# Patient Record
Sex: Female | Born: 1970 | Race: Black or African American | Hispanic: No | Marital: Single
Health system: Southern US, Community
[De-identification: ages and names within clinical notes are randomized; demographics above are authoritative.]

---

## 2004-07-01 ENCOUNTER — Emergency Department: Payer: Self-pay | Admitting: Emergency Medicine

## 2004-07-03 ENCOUNTER — Emergency Department: Payer: Self-pay | Admitting: Emergency Medicine

## 2004-07-05 ENCOUNTER — Ambulatory Visit: Payer: Self-pay | Admitting: Emergency Medicine

## 2004-07-09 ENCOUNTER — Ambulatory Visit: Payer: Self-pay | Admitting: Obstetrics & Gynecology

## 2006-02-13 IMAGING — US US PELV - US TRANSVAGINAL
1 series · 17 of 25 positions shown · non-contrast
Comparison: none

REASON FOR EXAM: Pregnant with vaginal bleeding
COMMENTS:

[Series 1: us pelv - us transvaginal · 17 of 41 slices shown]
[im 1/41]
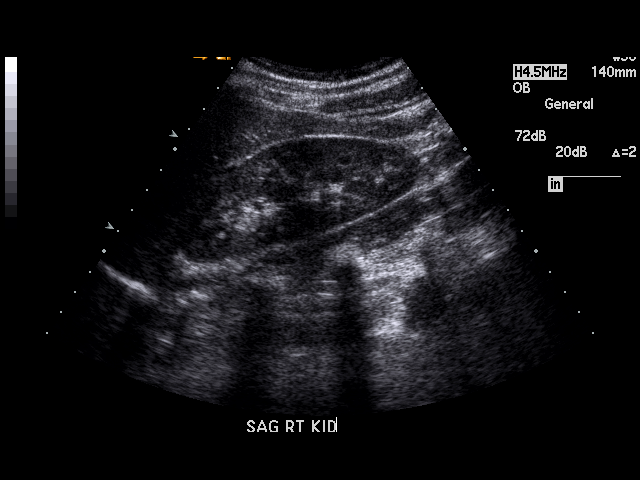
[im 4/41]
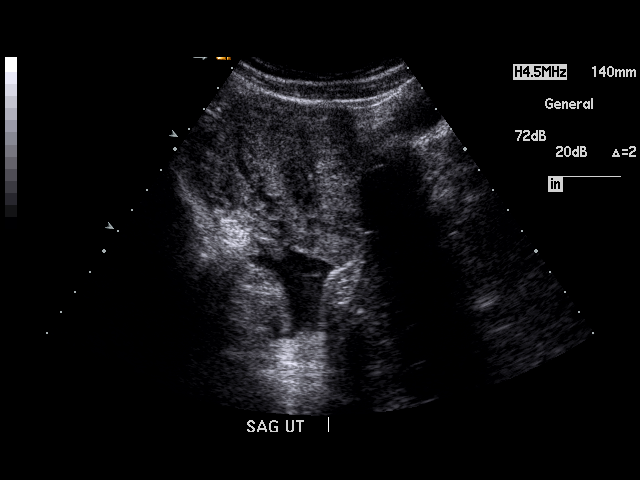
[im 6/41]
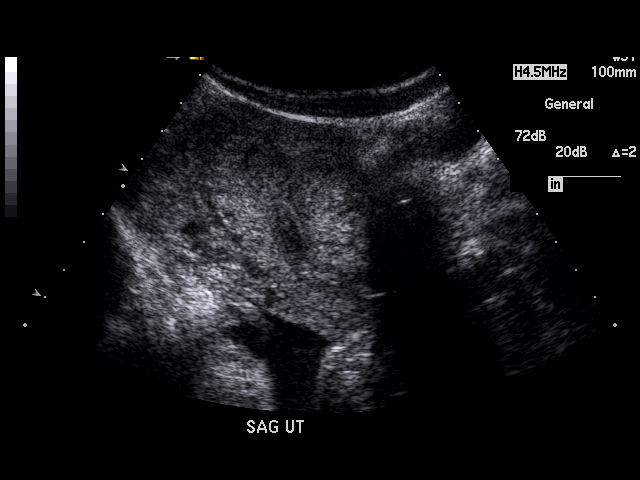
[im 9/41]
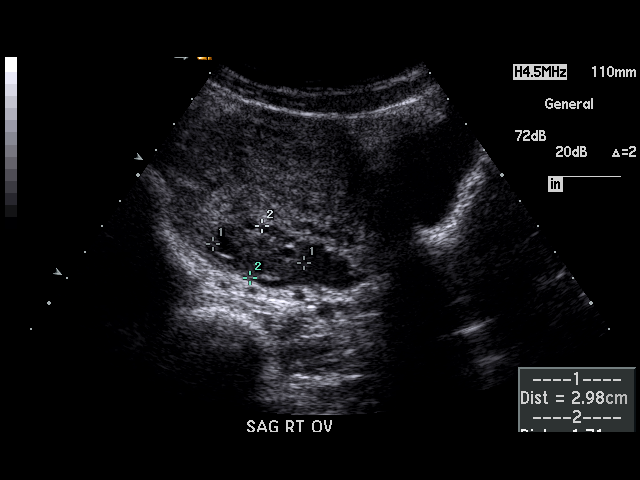
[im 11/41]
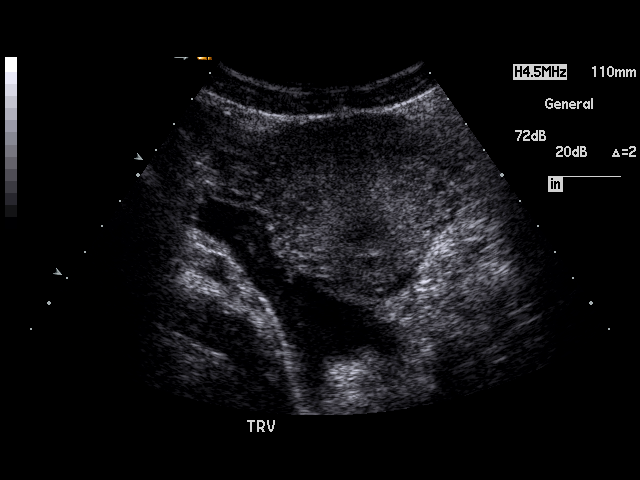
[im 14/41]
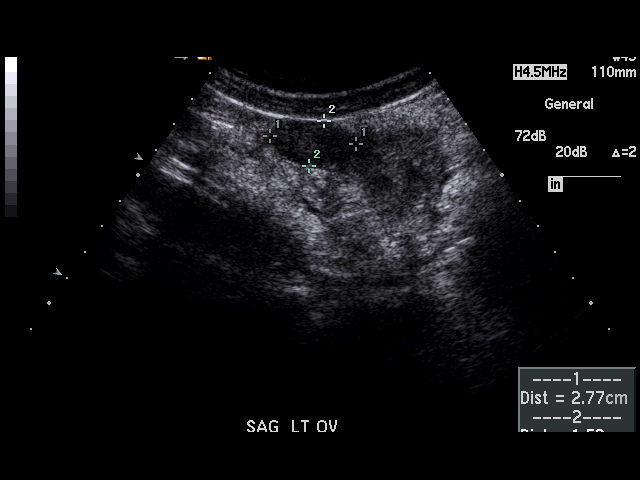
[im 16/41]
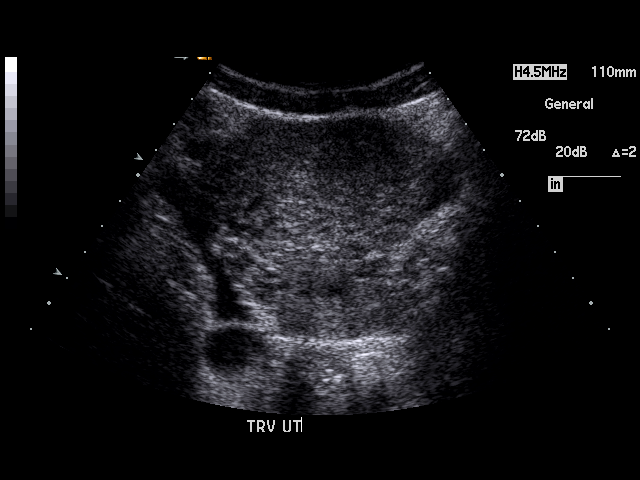
[im 19/41]
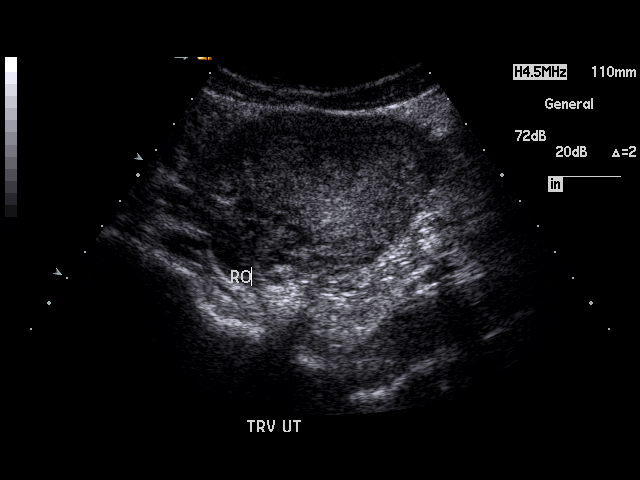
[im 21/41]
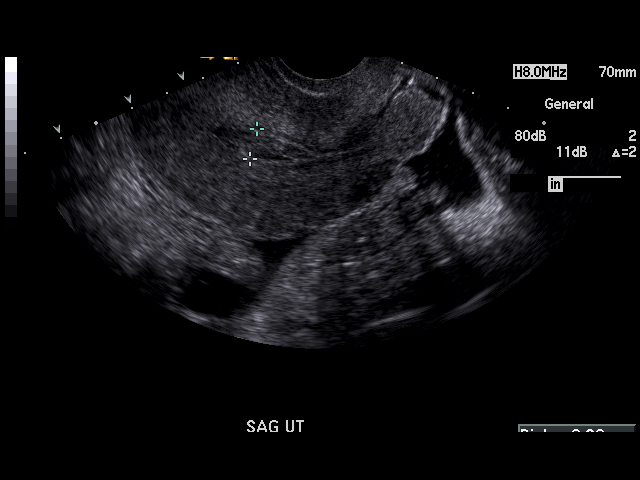
[im 22/41]
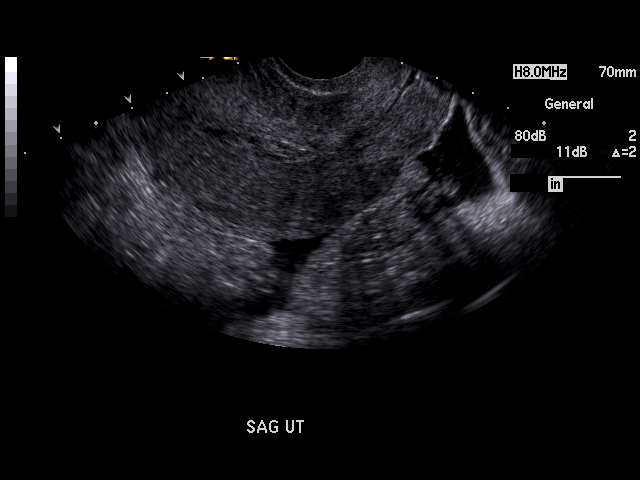
[im 26/41]
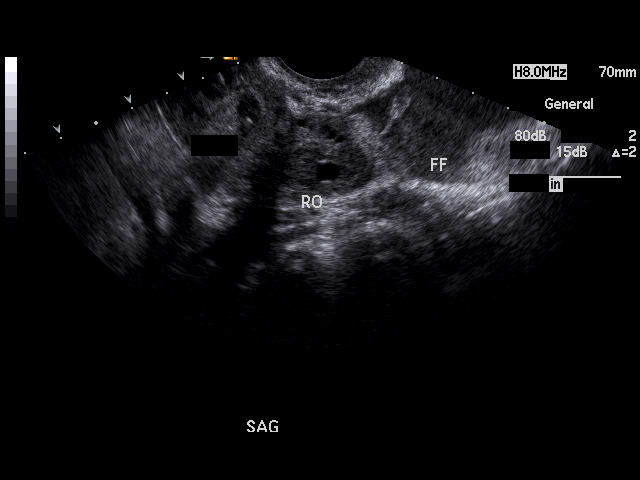
[im 27/41]
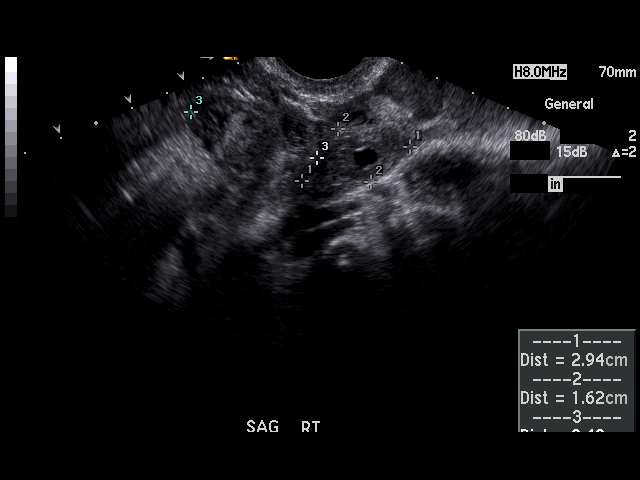
[im 31/41]
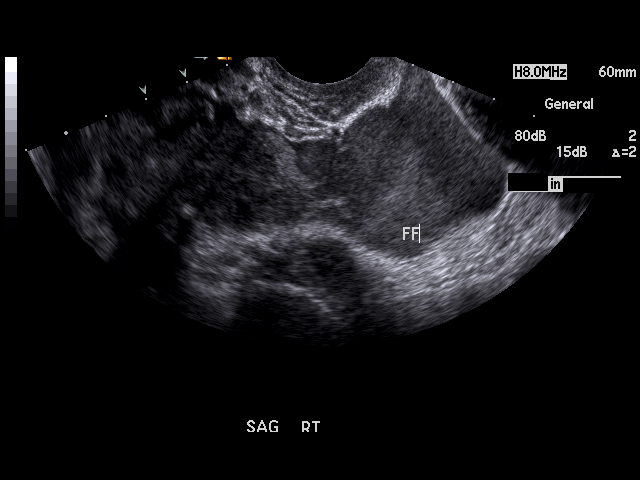
[im 32/41]
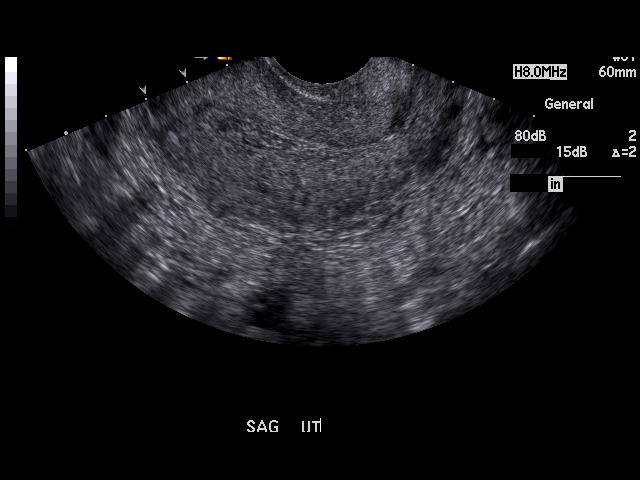
[im 36/41]
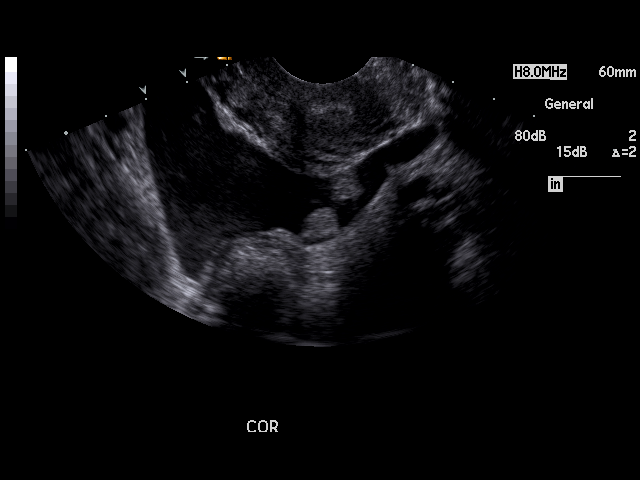
[im 37/41]
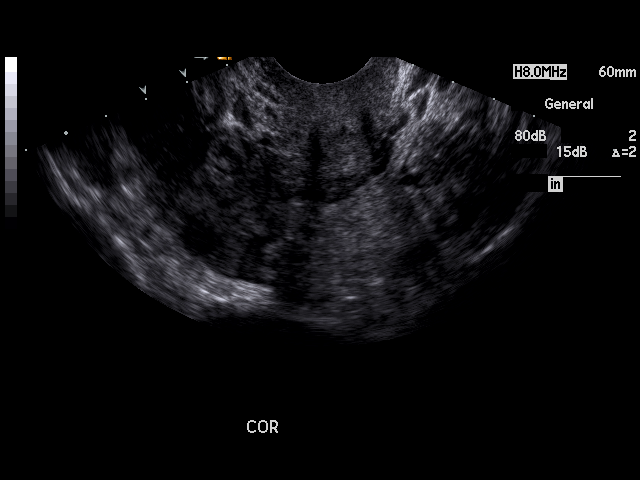
[im 41/41]
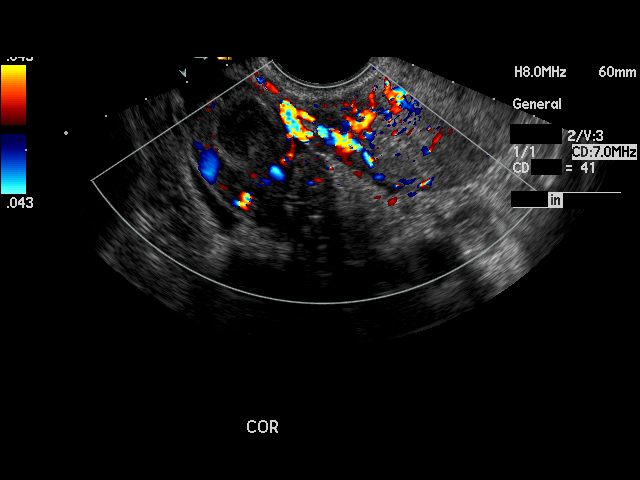

[17 of 25 positions shown; findings below may reference images not displayed]

PROCEDURE:     US  - US PELVIS MASS EXAM  - [DATE] [DATE] [DATE] [DATE]

RESULT:     The current exam is compared to a prior exam of 07/03/2004.

Again, no viable intrauterine gestation is identified. Since the prior study
there has developed a 3.5 cm complex mass in the RIGHT adnexal area adjacent
to the RIGHT ovary. There is a moderate amount of fluid present in the
pelvis which contains debris and could be cellular debris as seen with
hemorrhage. The findings are considered highly suspicious for RIGHT ectopic
pregnancy.
IMPRESSION: There are observed changes as described above consistent
with ectopic pregnancy on the RIGHT.

## 2007-09-23 ENCOUNTER — Inpatient Hospital Stay: Payer: Self-pay

## 2007-10-14 ENCOUNTER — Ambulatory Visit: Payer: Self-pay | Admitting: Obstetrics and Gynecology

## 2009-05-20 IMAGING — US ABDOMEN ULTRASOUND
1 series · 17 of 25 positions shown · non-contrast
Comparison: none

REASON FOR EXAM: severe RUQ abd pain    elevated blood pressure  call
report [id number]
COMMENTS:

[Series 1: abdomen ultrasound · 17 of 71 slices shown]
[im 1/71]
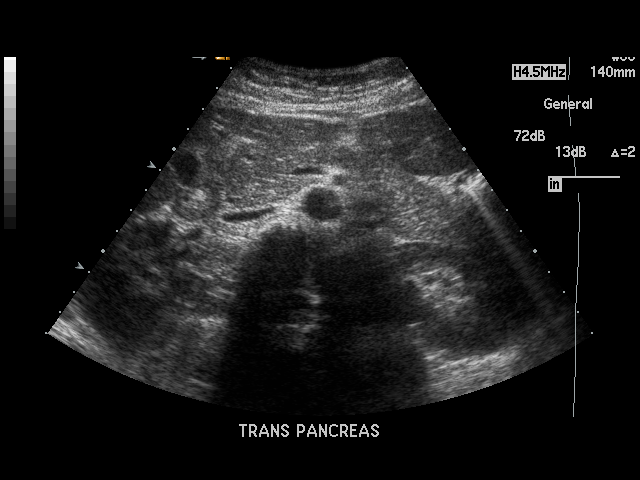
[im 6/71]
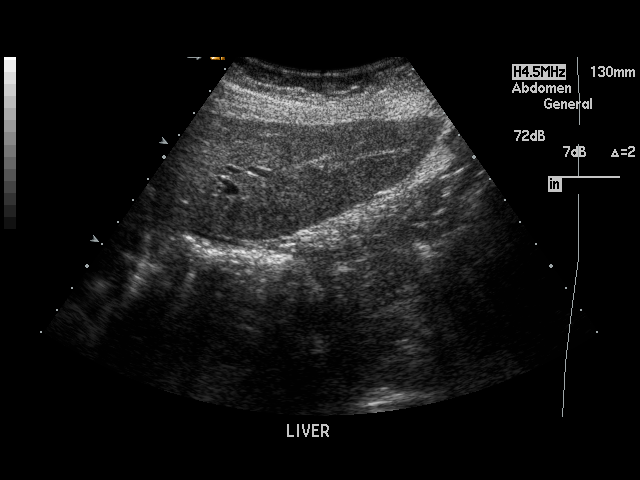
[im 9/71]
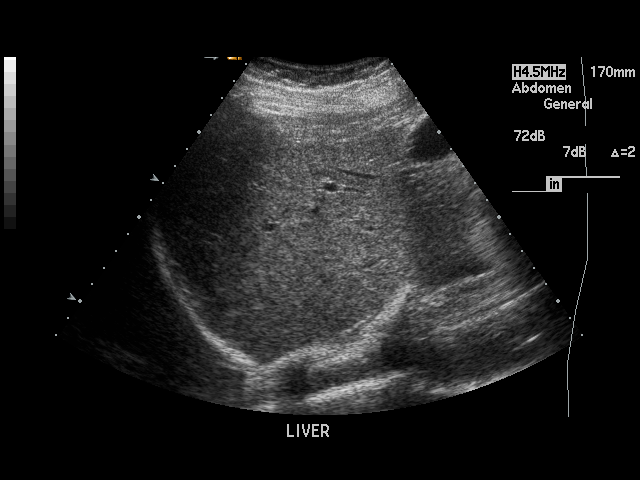
[im 15/71]
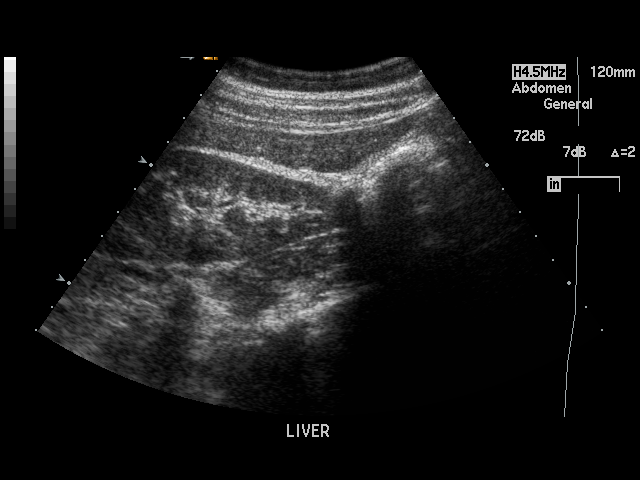
[im 18/71]
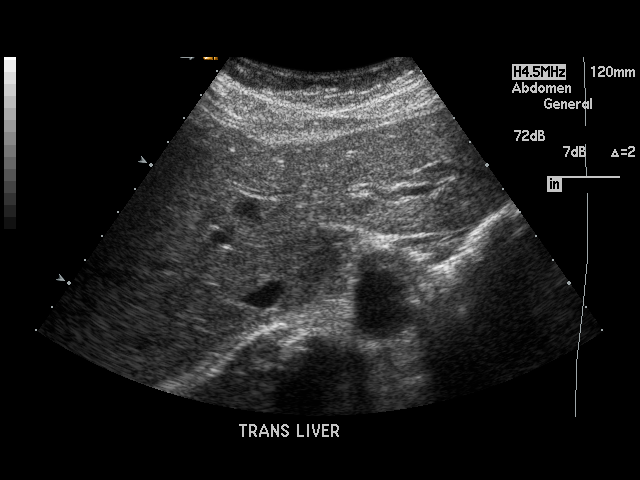
[im 24/71]
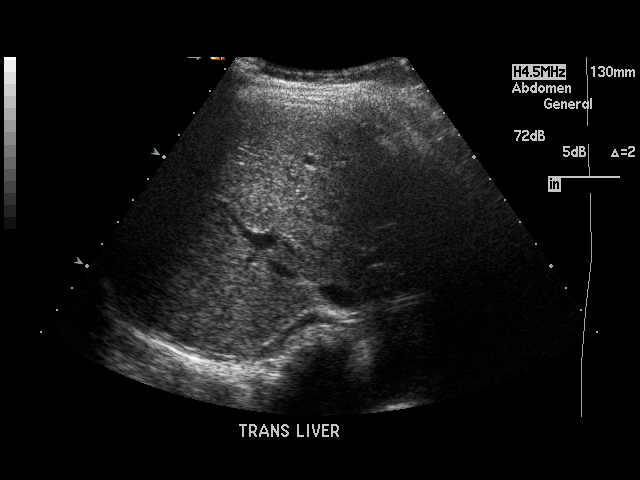
[im 27/71]
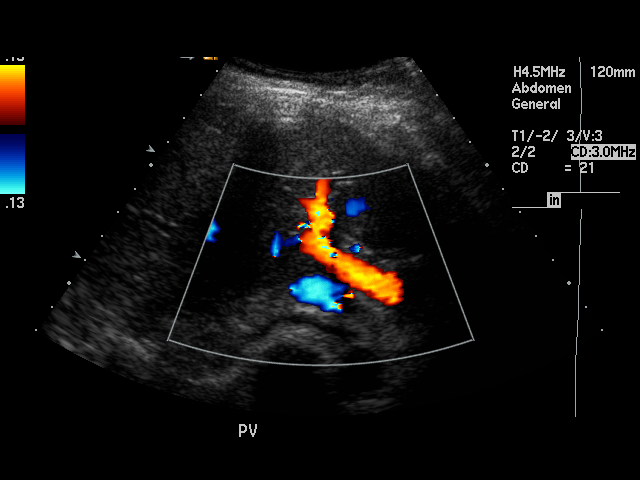
[im 33/71]
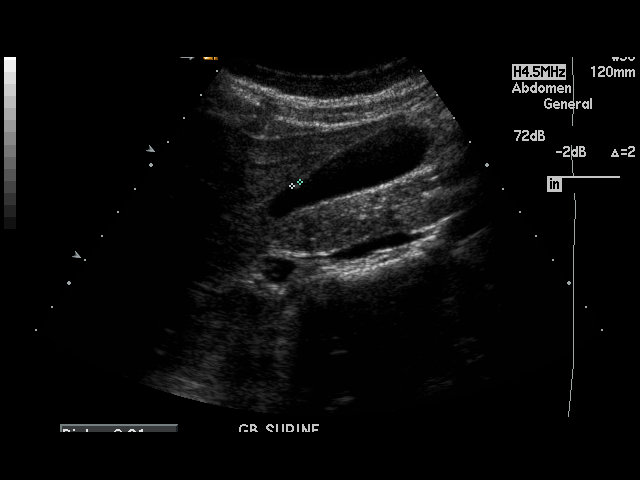
[im 36/71]
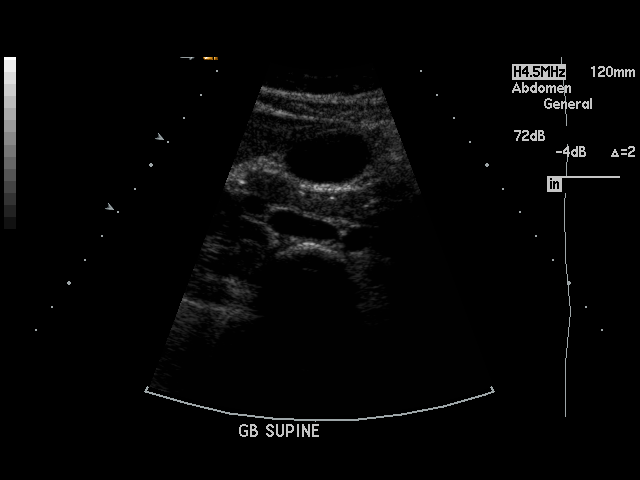
[im 38/71]
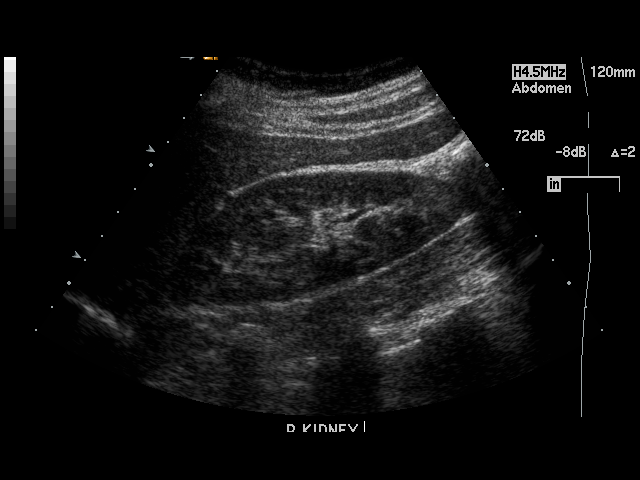
[im 44/71]
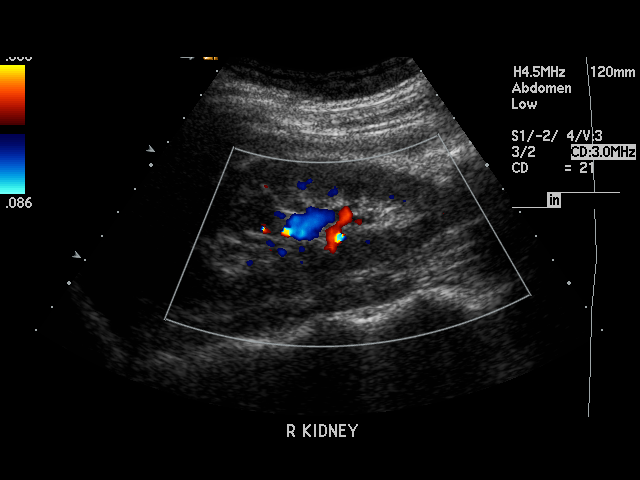
[im 47/71]
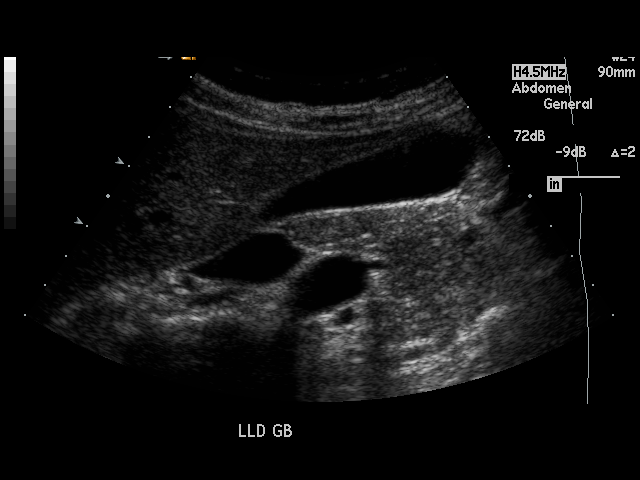
[im 53/71]
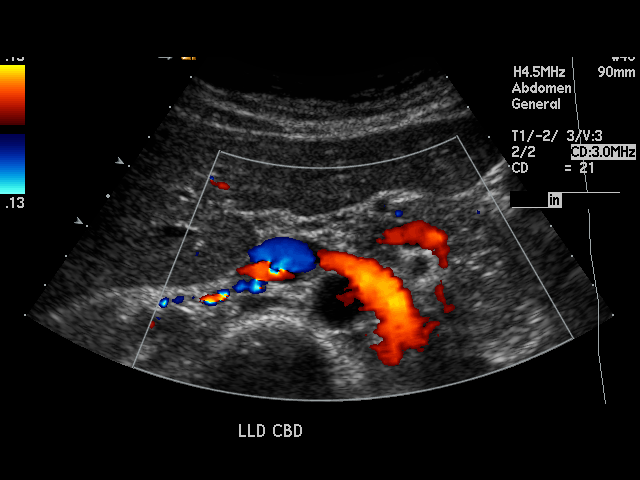
[im 56/71]
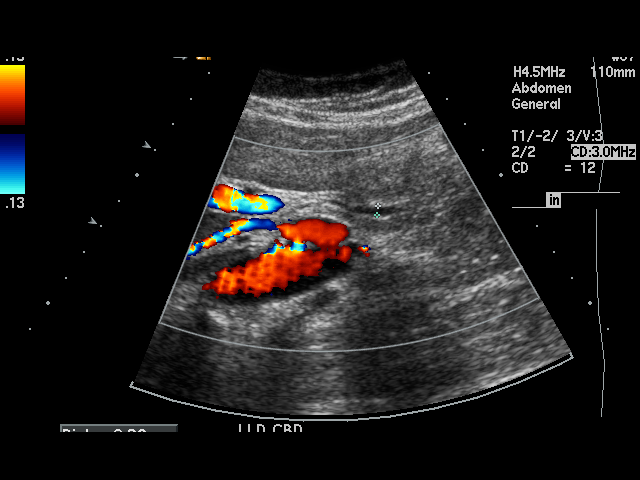
[im 62/71]
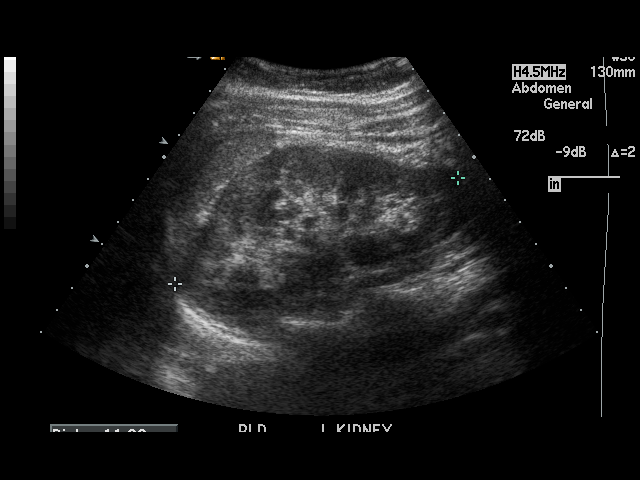
[im 65/71]
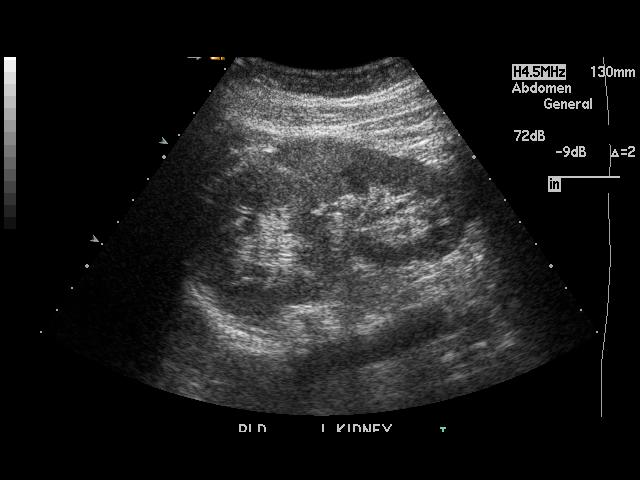
[im 71/71]
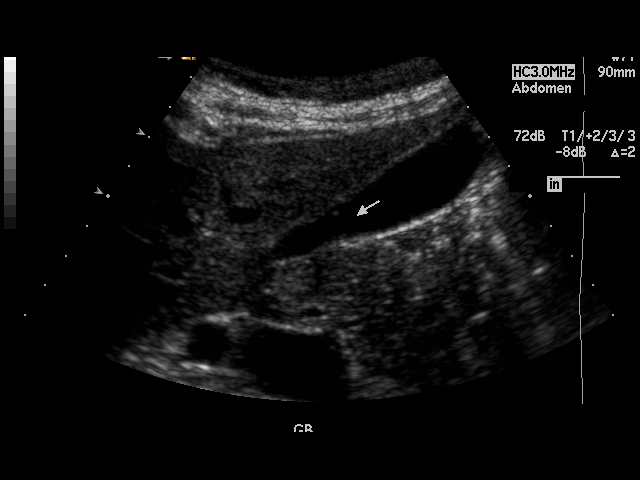

[17 of 25 positions shown; findings below may reference images not displayed]

PROCEDURE:     US  - US ABDOMEN GENERAL SURVEY  - October 14, 2007 [DATE]

RESULT:     The liver, spleen, pancreas, abdominal aorta and inferior vena
cava show no significant abnormalities. No gallstones are seen. There is a
3.1 mm nonmobile, nonshadowing echo density associated with the anterior
aspect of the gallbladder wall. The appearance is consistent with a small
polyp. There is no thickening of the gallbladder wall, which is noted to
measure 1.9 mm in thickness. The common bile duct measures 2.9 mm at maximum
diameter. The kidneys show no hydronephrosis. There is no ascites.
IMPRESSION: 1.     No gallstones are seen.
2.     There is a 3.1 mm echo density associated with the gallbladder wall
and consistent with a gallbladder polyp.

## 2011-04-27 ENCOUNTER — Emergency Department: Payer: Self-pay | Admitting: Emergency Medicine

## 2019-05-15 ENCOUNTER — Ambulatory Visit: Payer: Medicaid Other | Attending: Internal Medicine

## 2019-05-15 DIAGNOSIS — Z23 Encounter for immunization: Secondary | ICD-10-CM

## 2019-05-15 NOTE — Progress Notes (Signed)
   Covid-[facility] Vaccination [facility]  Name:  Kassadie Pancake    MRN: [id number] DOB: [birth year]  05/15/2019  Ms. Racette was observed post Covid-19 immunization for 15 minutes without incident. She was provided with Vaccine Information Sheet and instruction to access the V-Safe system.   Ms. Bridgers was instructed to call 911 with any severe reactions post vaccine: Marland Kitchen Difficulty breathing  . Swelling of face and throat  . A fast heartbeat  . A bad rash all over body  . Dizziness and weakness   Immunizations Administered    Name Date Dose VIS Date Route   Pfizer COVID-19 Vaccine 05/15/2019  4:24 PM 0.3 mL 01/28/2019 Intramuscular   Manufacturer: [employer], [employer]   Lot: [id number]   NDC: [address]-1

## 2019-06-05 ENCOUNTER — Ambulatory Visit: Payer: Medicaid Other | Attending: Internal Medicine

## 2019-06-05 DIAGNOSIS — Z23 Encounter for immunization: Secondary | ICD-10-CM

## 2019-06-05 NOTE — Progress Notes (Signed)
   Covid-19 Vaccination Clinic  Name:  Danielle Fritz    MRN: [id number] DOB: 1970-03-10  06/05/2019  [name] was observed post Covid-19 immunization for 15 minutes without incident. She was provided with Vaccine Information Sheet and instruction to access the V-Safe system.   [name] was instructed to call 911 with any severe reactions post vaccine: Marland Kitchen Difficulty breathing  . Swelling of face and throat  . A fast heartbeat  . A bad rash all over body  . Dizziness and weakness   Immunizations Administered    Name Date Dose VIS Date Route   Pfizer COVID-19 Vaccine 06/05/2019  4:08 PM 0.3 mL 01/28/2019 Intramuscular   Manufacturer: [employer], [employer]   Lot: [id number]   NDC: [address]-4

## 2021-01-22 ENCOUNTER — Ambulatory Visit: Payer: Self-pay | Admitting: Dermatology

## 2021-02-21 ENCOUNTER — Ambulatory Visit: Payer: Self-pay | Admitting: Dermatology
